# Patient Record
Sex: Female | Born: 1950 | Race: White | Hispanic: No | Marital: Married | Smoking: Never smoker
Health system: Southern US, Community
[De-identification: ages and names within clinical notes are randomized; demographics above are authoritative.]

## PROBLEM LIST (undated history)

## (undated) DIAGNOSIS — I4891 Unspecified atrial fibrillation: Secondary | ICD-10-CM

## (undated) HISTORY — PX: PACEMAKER IMPLANT: EP1218

---

## 2018-12-25 ENCOUNTER — Emergency Department (HOSPITAL_COMMUNITY)
Admission: EM | Admit: 2018-12-25 | Discharge: 2018-12-25 | Disposition: A | Discharge status: Home / Self Care | Payer: Medicare HMO | Attending: Emergency Medicine | Admitting: Emergency Medicine

## 2018-12-25 ENCOUNTER — Emergency Department (HOSPITAL_COMMUNITY): Payer: Medicare HMO

## 2018-12-25 ENCOUNTER — Other Ambulatory Visit: Payer: Self-pay

## 2018-12-25 ENCOUNTER — Encounter (HOSPITAL_COMMUNITY): Payer: Self-pay | Admitting: Emergency Medicine

## 2018-12-25 DIAGNOSIS — S59911A Unspecified injury of right forearm, initial encounter: Secondary | ICD-10-CM | POA: Diagnosis present

## 2018-12-25 DIAGNOSIS — Z7901 Long term (current) use of anticoagulants: Secondary | ICD-10-CM | POA: Diagnosis not present

## 2018-12-25 DIAGNOSIS — Z86718 Personal history of other venous thrombosis and embolism: Secondary | ICD-10-CM | POA: Diagnosis not present

## 2018-12-25 DIAGNOSIS — S52531A Colles' fracture of right radius, initial encounter for closed fracture: Secondary | ICD-10-CM | POA: Insufficient documentation

## 2018-12-25 DIAGNOSIS — Y939 Activity, unspecified: Secondary | ICD-10-CM | POA: Diagnosis not present

## 2018-12-25 DIAGNOSIS — S0990XA Unspecified injury of head, initial encounter: Secondary | ICD-10-CM | POA: Diagnosis not present

## 2018-12-25 DIAGNOSIS — Y999 Unspecified external cause status: Secondary | ICD-10-CM | POA: Diagnosis not present

## 2018-12-25 DIAGNOSIS — Y929 Unspecified place or not applicable: Secondary | ICD-10-CM | POA: Diagnosis not present

## 2018-12-25 MED ORDER — OXYCODONE-ACETAMINOPHEN 5-325 MG PO TABS
1.0000 | ORAL_TABLET | Freq: Once | ORAL | Status: AC
  Administered 2018-12-25: 1 via ORAL
  Filled 2018-12-25: qty 1

## 2018-12-25 MED ORDER — HYDROCODONE-ACETAMINOPHEN 5-325 MG PO TABS: 1.0000 | ORAL_TABLET | ORAL | 0 refills | Status: AC | PRN
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
## 2018-12-25 NOTE — Discharge Instructions (Signed)
Keep the arm elevated and use ice.  Follow-up with Dr. Apolonio Schneiders next week.  Take the pain medication as prescribed.  Do not take your methadone when you are taking the pain medication.  Return to the ED if worsening pain, weakness, numbness, tingling, or other concerns.
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
## 2019-09-27 ENCOUNTER — Other Ambulatory Visit: Payer: Self-pay

## 2019-09-27 ENCOUNTER — Ambulatory Visit
Admission: EM | Admit: 2019-09-27 | Discharge: 2019-09-27 | Disposition: A | Discharge status: Home / Self Care | Payer: BC Managed Care – PPO | Attending: Emergency Medicine | Admitting: Emergency Medicine

## 2019-09-27 DIAGNOSIS — Z9189 Other specified personal risk factors, not elsewhere classified: Secondary | ICD-10-CM

## 2019-09-27 NOTE — ED Triage Notes (Signed)
Exposure to covid, no s/s. Pt has had vaccine

## 2019-09-29 LAB — NOVEL CORONAVIRUS, NAA: SARS-CoV-2, NAA: NOT DETECTED

## 2020-08-08 IMAGING — DX DG WRIST COMPLETE 3+V*R*
4 series · 4 of 4 positions shown · non-contrast
Comparison: None.

CLINICAL DATA: Fall on outstretched hand, right wrist injury.

EXAM:
RIGHT WRIST - COMPLETE 3+ VIEW

[wrist pa]
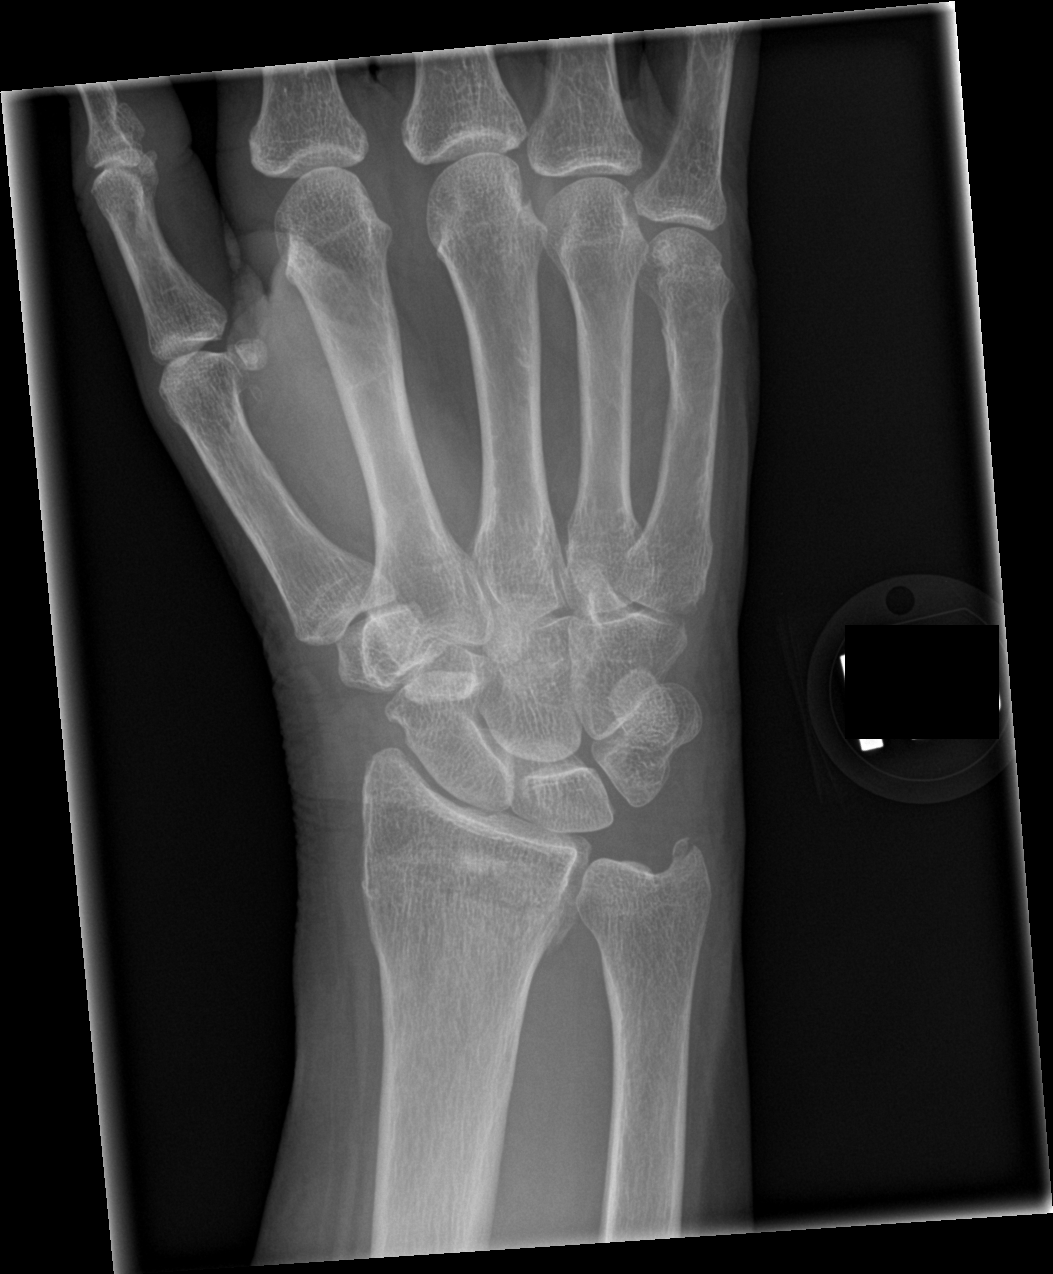

[wrist obl]
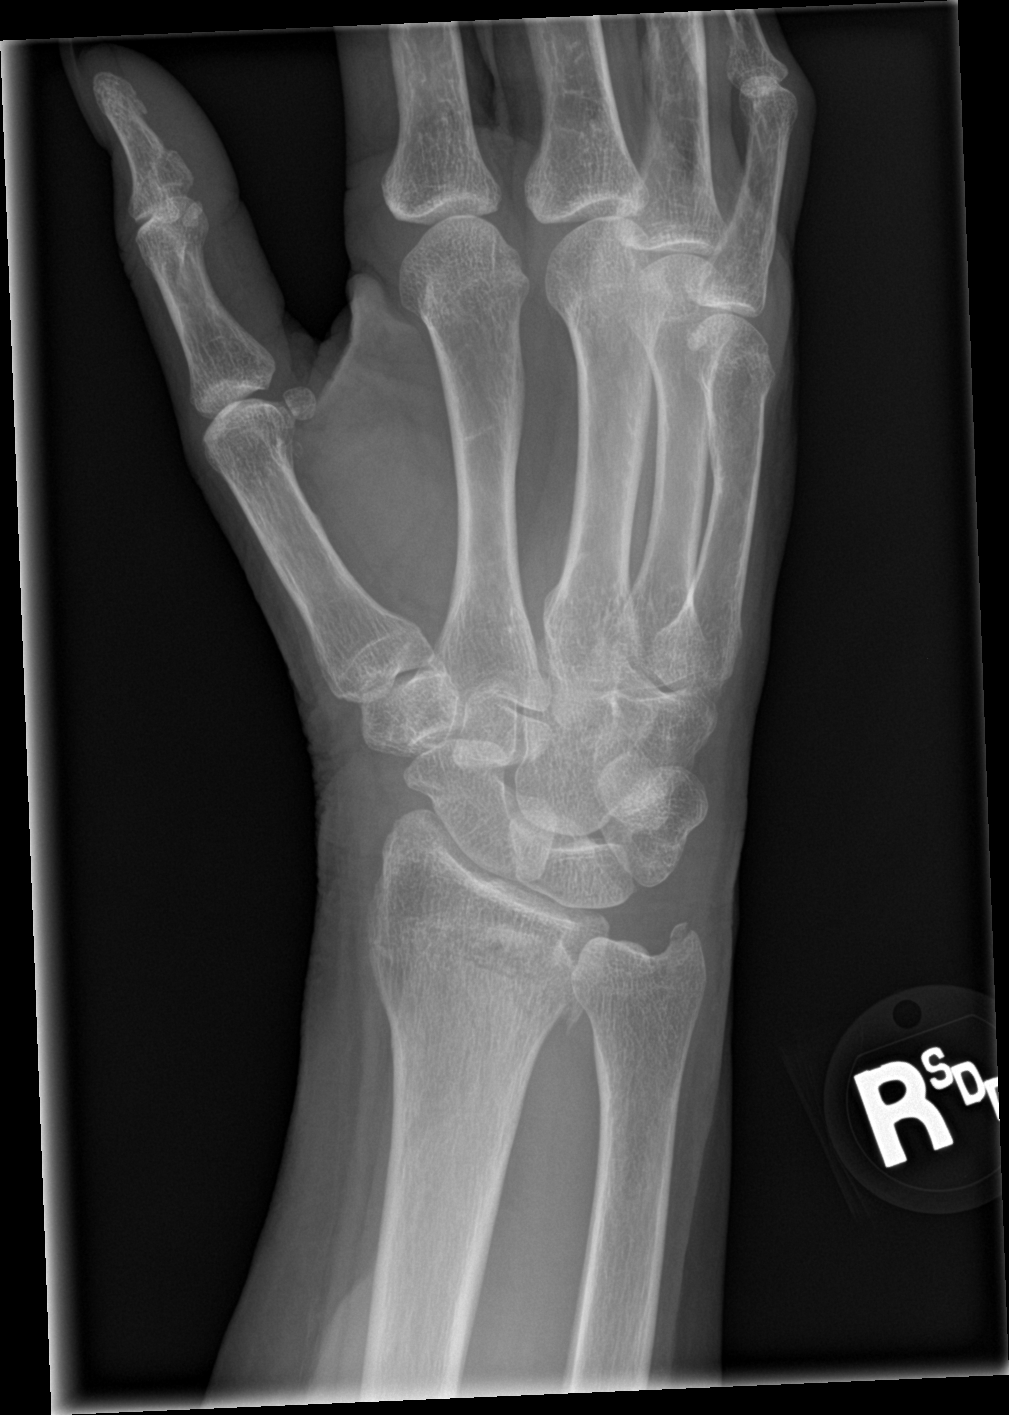

[wrist lat]
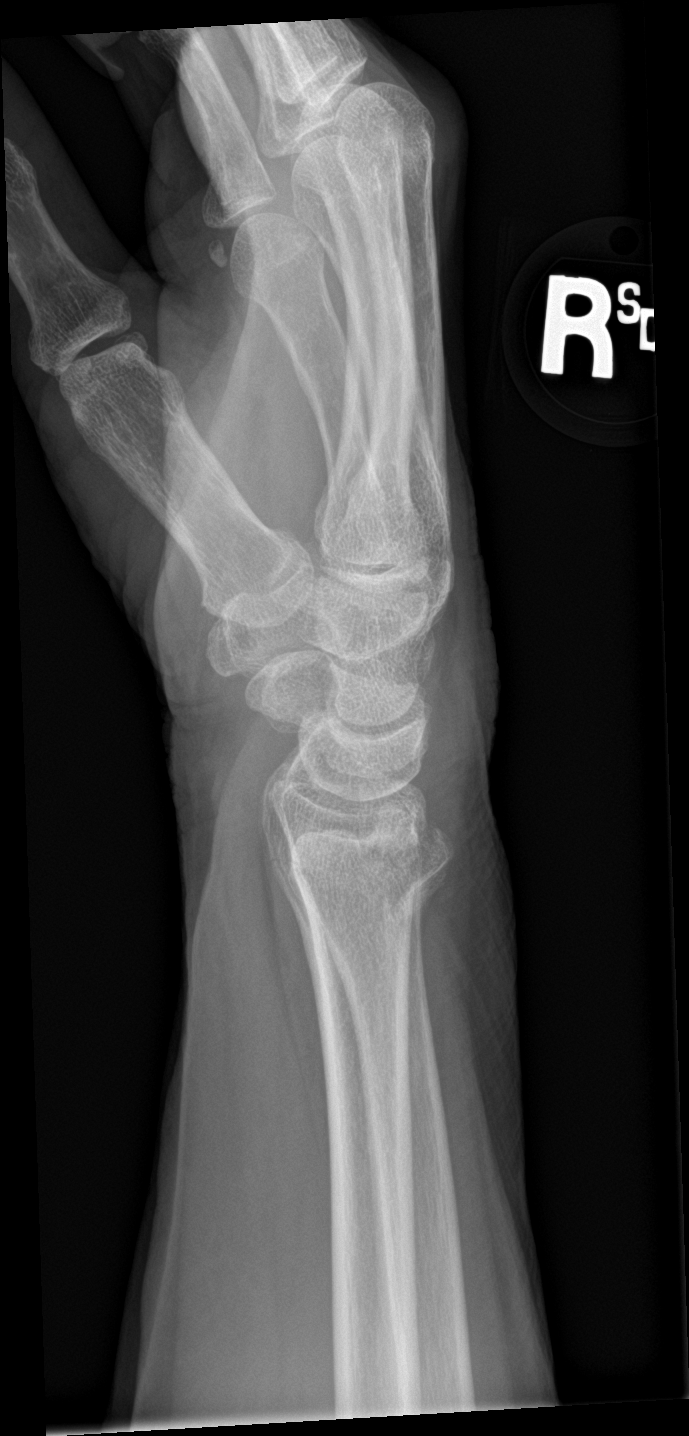

[wrist navicular]
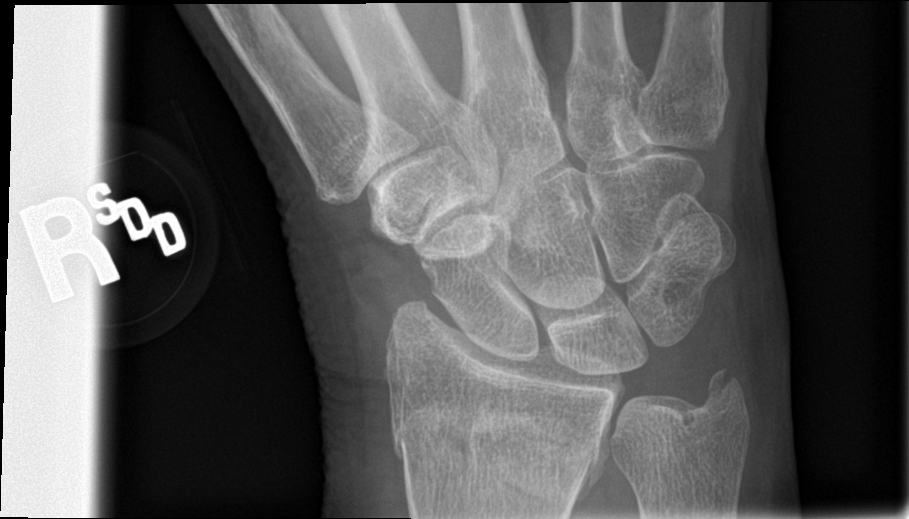

[4 of 4 positions shown; findings below may reference images not displayed]

FINDINGS: Colles fracture of the distal radius with extension into the distal
radioulnar joint and probable nondisplaced extension into the distal
radiocarpal surface.

Small fracture of the tip of the ulnar styloid.
IMPRESSION: 1. Colles fracture of the distal radius with extension into the
distal radioulnar joint, a potential subtle extension into the
distal radiocarpal surface.
2. Small fracture of the tip of the ulnar styloid.

## 2022-02-21 ENCOUNTER — Ambulatory Visit
Admission: EM | Admit: 2022-02-21 | Discharge: 2022-02-21 | Disposition: A | Discharge status: Home / Self Care | Payer: Medicare HMO | Attending: Nurse Practitioner | Admitting: Nurse Practitioner

## 2022-02-21 DIAGNOSIS — J069 Acute upper respiratory infection, unspecified: Secondary | ICD-10-CM | POA: Diagnosis present

## 2022-02-21 DIAGNOSIS — Z1152 Encounter for screening for COVID-19: Secondary | ICD-10-CM | POA: Diagnosis present

## 2022-02-21 DIAGNOSIS — R062 Wheezing: Secondary | ICD-10-CM | POA: Insufficient documentation

## 2022-02-21 HISTORY — DX: Unspecified atrial fibrillation: I48.91

## 2022-02-21 MED ORDER — ALBUTEROL SULFATE HFA 108 (90 BASE) MCG/ACT IN AERS: 1.0000 | INHALATION_SPRAY | RESPIRATORY_TRACT | 0 refills | Status: AC | PRN

## 2022-02-21 MED ORDER — PREDNISONE 20 MG PO TABS: 40.0000 mg | ORAL_TABLET | Freq: Every day | ORAL | 0 refills | Status: AC

## 2022-02-21 NOTE — ED Triage Notes (Signed)
Pt reports she has cough, lost her voice/throat pain and feels like it is getting worse since she had pneumonia and the flu on christmas.

## 2022-02-21 NOTE — ED Provider Notes (Signed)
[facility]    CSN: [id number] Arrival date & time: 02/21/22  1234      History   Chief Complaint No chief complaint on file.   HPI Hailey Fischer is a 72 y.o. female.   Patient presents today for 1 month history of dry cough.  Reports symptoms acutely worsened yesterday and the cough turned congested again.  Also endorses hoarseness, sore throat, nasal congestion, runny nose and postnasal drainage, headache, left ear pain, and fatigue that has began since yesterday.  Reports she was diagnosed with influenza around Christmas and has never fully recovered.  Reports she has been using a rescue inhaler for wheezing which does seem to help.  Reports appetite has not returned to what it was before Christmas.  Has not not been taking anything new for symptoms since they started.  Patient reports her primary care provider recently told her she may have a "touch" of asthma.    Past Medical History:  Diagnosis Date   Atrial fibrillation ([place])     There are no problems to display for this patient.   Past Surgical History:  Procedure Laterality Date   PACEMAKER IMPLANT      OB History   No obstetric history on file.      Home Medications    Prior to Admission medications   Medication Sig Start Date End Date Taking? Authorizing Provider  clonazePAM (KLONOPIN) 0.5 MG disintegrating tablet  01/27/21  Yes [provider]  metoprolol succinate (TOPROL-XL) 50 MG 24 hr tablet Take 50 mg by mouth 2 (two) times daily 04/16/21  Yes [provider]  Pramipexole Dihydrochloride 1.5 MG TB24 Take by mouth. 05/02/18  Yes [provider]  predniSONE (DELTASONE) 20 MG tablet Take 2 tablets (40 mg total) by mouth daily with breakfast for 5 days. 02/21/22 02/26/22 Yes Eulogio Bear, NP  albuterol (VENTOLIN HFA) 108 (90 Base) MCG/ACT inhaler Inhale 1-2 puffs into the lungs every 4 (four) hours as needed for wheezing or shortness of breath. 02/21/22    Eulogio Bear, NP  Cholecalciferol 50 MCG (2000 UT) CAPS Take by mouth.    [provider]  diclofenac Sodium (VOLTAREN) 1 % GEL SMARTSIG:4 Gram(s) Topical Daily PRN    [provider]  DULoxetine (CYMBALTA) 60 MG capsule Take 60 mg by mouth daily.    [provider]  ELIQUIS 5 MG TABS tablet Take 5 mg by mouth 2 (two) times daily.    [provider]  fluticasone (FLONASE) 50 MCG/ACT nasal spray SMARTSIG:1 Spray(s) Both Nares Twice Daily PRN    [provider]  HYDROcodone-acetaminophen (NORCO/VICODIN) 5-325 MG tablet Take 1 tablet by mouth every 4 (four) hours as needed. 12/25/18   Rancour, Annie Main, MD  pantoprazole (PROTONIX) 40 MG tablet Take 40 mg by mouth daily.    [provider]  traMADol (ULTRAM) 50 MG tablet Take 50-100 mg by mouth 3 (three) times daily as needed.    [provider]    Family History History reviewed. No pertinent family history.  Social History Social History   Tobacco Use   Smoking status: Never   Smokeless tobacco: Never  Substance Use Topics   Alcohol use: Never   Drug use: Never     Allergies   Codeine, Promethazine, and Penicillins   Review of Systems Review of Systems Per HPI  Physical Exam Triage Vital Signs ED Triage Vitals  Enc Vitals Group     BP 02/21/22 1318 (!) 149/81  Pulse Rate 02/21/22 1318 77     Resp 02/21/22 1318 20     Temp 02/21/22 1318 98.2 F (36.8 C)     Temp Source 02/21/22 1318 Oral     SpO2 02/21/22 1318 95 %     Weight --      Height --      Head Circumference --      Peak Flow --      Pain Score 02/21/22 1329 5     Pain Loc --      Pain Edu? --      Excl. in GC? --    No data found.  Updated Vital Signs BP (!) 149/81 (BP Location: Right Arm)   Pulse 77   Temp 98.2 F (36.8 C) (Oral)   Resp 20   SpO2 95%   Visual Acuity Right Eye Distance:   Left Eye Distance:   Bilateral Distance:    Right Eye Near:   Left Eye Near:     Bilateral Near:     Physical Exam Vitals and nursing note reviewed.  Constitutional:      General: She is not in acute distress.    Appearance: Normal appearance. She is not ill-appearing or toxic-appearing.  HENT:     Head: Normocephalic and atraumatic.     Right Ear: Tympanic membrane, ear canal and external ear normal.     Left Ear: Tympanic membrane, ear canal and external ear normal.     Nose: Congestion present. No rhinorrhea.     Mouth/Throat:     Mouth: Mucous membranes are moist.     Pharynx: Oropharynx is clear. Posterior oropharyngeal erythema present. No oropharyngeal exudate.  Eyes:     General: No scleral icterus.    Extraocular Movements: Extraocular movements intact.  Cardiovascular:     Rate and Rhythm: Normal rate and regular rhythm.  Pulmonary:     Effort: Pulmonary effort is normal. No respiratory distress.     Breath sounds: Wheezing present. No rhonchi or rales.  Abdominal:     General: Abdomen is flat. Bowel sounds are normal. There is no distension.     Palpations: Abdomen is soft.     Tenderness: There is no abdominal tenderness. There is no guarding.  Musculoskeletal:     Cervical back: Normal range of motion and neck supple.  Lymphadenopathy:     Cervical: No cervical adenopathy.  Skin:    General: Skin is warm and dry.     Coloration: Skin is not jaundiced or pale.     Findings: No erythema or rash.  Neurological:     Mental Status: She is alert and oriented to person, place, and time.  Psychiatric:        Behavior: Behavior is cooperative.      UC Treatments / Results  Labs (all labs ordered are listed, but only abnormal results are displayed) Labs Reviewed  SARS CORONAVIRUS 2 (TAT 6-24 HRS)    EKG   Radiology No results found.  Procedures Procedures (including critical care time)  Medications Ordered in UC Medications - No data to display  Initial Impression / Assessment and Plan / UC Course  I have reviewed the triage  vital signs and the nursing notes.  Pertinent labs & imaging results that were available during my care of the patient were reviewed by me and considered in my medical decision making (see chart for details).   Patient is well-appearing, normotensive, afebrile, not tachycardic, not tachypneic, oxygenating well on room  air.    1. Encounter for screening for COVID-19 2. Viral URI with cough 3. Wheezing Suspect new viral syndrome given recent worsening symptoms COVID-19 testing obtained Patient is a candidate for molnupiravir if she tests positive Supportive care discussed Start prednisone to help with the wheezing Recommended close follow-up with primary care provider for ongoing cough/possible uncontrolled asthma - refill given for albuterol inhaler ER and return precautions discussed Note given for work  The patient was given the opportunity to ask questions.  All questions answered to their satisfaction.  The patient is in agreement to this plan.    Final Clinical Impressions(s) / UC Diagnoses   Final diagnoses:  Encounter for screening for COVID-19  Viral URI with cough  Wheezing     Discharge Instructions      You have a viral upper respiratory infection.  Symptoms should improve over the next week to 10 days.  If you develop chest pain or shortness of breath, go to the emergency room.  We have tested you today for COVID-19.  You will see the results in Mychart and we will call you with positive results.    Please stay home and isolate until you are aware of the results.    Please start the prednisone to help with the wheezing.  Plan to follow up with your PCP to discuss the asthma in the next 1-2 weeks.  Some things that can make you feel better are: - Increased rest - Increasing fluid with water/sugar free electrolytes - Acetaminophen and ibuprofen as needed for fever/pain - Salt water gargling, chloraseptic spray and throat lozenges for sore throat - OTC guaifenesin  (Mucinex) 600 mg twice daily for congestion - Saline sinus flushes or a neti pot - Humidifying the air     ED Prescriptions     Medication Sig Dispense Auth. Provider   predniSONE (DELTASONE) 20 MG tablet Take 2 tablets (40 mg total) by mouth daily with breakfast for 5 days. 10 tablet Noemi Chapel A, NP   albuterol (VENTOLIN HFA) 108 (90 Base) MCG/ACT inhaler Inhale 1-2 puffs into the lungs every 4 (four) hours as needed for wheezing or shortness of breath. 18 g Eulogio Bear, NP      PDMP not reviewed this encounter.   Eulogio Bear, NP 02/21/22 1453

## 2022-02-21 NOTE — Discharge Instructions (Addendum)
You have a viral upper respiratory infection.  Symptoms should improve over the next week to 10 days.  If you develop chest pain or shortness of breath, go to the emergency room.  We have tested you today for COVID-19.  You will see the results in Mychart and we will call you with positive results.    Please stay home and isolate until you are aware of the results.    Please start the prednisone to help with the wheezing.  Plan to follow up with your PCP to discuss the asthma in the next 1-2 weeks.  Some things that can make you feel better are: - Increased rest - Increasing fluid with water/sugar free electrolytes - Acetaminophen and ibuprofen as needed for fever/pain - Salt water gargling, chloraseptic spray and throat lozenges for sore throat - OTC guaifenesin (Mucinex) 600 mg twice daily for congestion - Saline sinus flushes or a neti pot - Humidifying the air

## 2022-02-22 LAB — SARS CORONAVIRUS 2 (TAT 6-24 HRS): SARS Coronavirus 2: NEGATIVE
